# Patient Record
Sex: Male | Born: 1960 | Race: White | Hispanic: No | Marital: Single | Smoking: Never smoker
Health system: Southern US, Community
[De-identification: ages and names within clinical notes are randomized; demographics above are authoritative.]

## PROBLEM LIST (undated history)

## (undated) HISTORY — PX: LIPOMA EXCISION: SHX5283

---

## 2017-06-02 ENCOUNTER — Emergency Department (HOSPITAL_BASED_OUTPATIENT_CLINIC_OR_DEPARTMENT_OTHER)
Admission: EM | Admit: 2017-06-02 | Discharge: 2017-06-02 | Disposition: A | Discharge status: Home / Self Care | Payer: BLUE CROSS/BLUE SHIELD | Attending: Emergency Medicine | Admitting: Emergency Medicine

## 2017-06-02 ENCOUNTER — Other Ambulatory Visit: Payer: Self-pay

## 2017-06-02 ENCOUNTER — Emergency Department (HOSPITAL_BASED_OUTPATIENT_CLINIC_OR_DEPARTMENT_OTHER): Payer: BLUE CROSS/BLUE SHIELD

## 2017-06-02 ENCOUNTER — Encounter (HOSPITAL_BASED_OUTPATIENT_CLINIC_OR_DEPARTMENT_OTHER): Payer: Self-pay | Admitting: Emergency Medicine

## 2017-06-02 DIAGNOSIS — Z79899 Other long term (current) drug therapy: Secondary | ICD-10-CM | POA: Diagnosis not present

## 2017-06-02 DIAGNOSIS — N201 Calculus of ureter: Secondary | ICD-10-CM

## 2017-06-02 DIAGNOSIS — R39198 Other difficulties with micturition: Secondary | ICD-10-CM | POA: Insufficient documentation

## 2017-06-02 DIAGNOSIS — R112 Nausea with vomiting, unspecified: Secondary | ICD-10-CM | POA: Diagnosis not present

## 2017-06-02 DIAGNOSIS — R1031 Right lower quadrant pain: Secondary | ICD-10-CM | POA: Diagnosis present

## 2017-06-02 LAB — COMPREHENSIVE METABOLIC PANEL
ALBUMIN: 4.1 g/dL (ref 3.5–5.0)
ALT: 19 U/L (ref 17–63)
AST: 32 U/L (ref 15–41)
Alkaline Phosphatase: 75 U/L (ref 38–126)
Anion gap: 12 (ref 5–15)
BILIRUBIN TOTAL: 0.6 mg/dL (ref 0.3–1.2)
BUN: 17 mg/dL (ref 6–20)
CHLORIDE: 104 mmol/L (ref 101–111)
CO2: 22 mmol/L (ref 22–32)
Calcium: 8.9 mg/dL (ref 8.9–10.3)
Creatinine, Ser: 1.34 mg/dL — ABNORMAL HIGH (ref 0.61–1.24)
GFR calc Af Amer: >60 mL/min (ref >60)
GFR calc non Af Amer: 59 mL/min — ABNORMAL LOW (ref >60)
GLUCOSE: 134 mg/dL — AB (ref 65–99)
POTASSIUM: 3.8 mmol/L (ref 3.5–5.1)
Sodium: 138 mmol/L (ref 135–145)
TOTAL PROTEIN: 7.3 g/dL (ref 6.5–8.1)

## 2017-06-02 LAB — CBC
HEMATOCRIT: 42.3 % (ref 39.0–52.0)
Hemoglobin: 15.4 g/dL (ref 13.0–17.0)
MCH: 31.6 pg (ref 26.0–34.0)
MCHC: 36.4 g/dL — AB (ref 30.0–36.0)
MCV: 86.9 fL (ref 78.0–100.0)
Platelets: 184 10*3/uL (ref 150–400)
RBC: 4.87 MIL/uL (ref 4.22–5.81)
RDW: 12.3 % (ref 11.5–15.5)
WBC: 10.3 10*3/uL (ref 4.0–10.5)

## 2017-06-02 LAB — LIPASE, BLOOD: Lipase: 24 U/L (ref 11–51)

## 2017-06-02 LAB — URINALYSIS, ROUTINE W REFLEX MICROSCOPIC
BILIRUBIN URINE: NEGATIVE
GLUCOSE, UA: NEGATIVE mg/dL
KETONES UR: NEGATIVE mg/dL
Leukocytes, UA: NEGATIVE
Nitrite: NEGATIVE
Protein, ur: NEGATIVE mg/dL
Specific Gravity, Urine: 1.01 (ref 1.005–1.030)
pH: 7.5 (ref 5.0–8.0)

## 2017-06-02 LAB — URINALYSIS, MICROSCOPIC (REFLEX): Squamous Epithelial / LPF: NONE SEEN

## 2017-06-02 MED ORDER — OXYCODONE-ACETAMINOPHEN 5-325 MG PO TABS: 1.0000 | ORAL_TABLET | Freq: Four times a day (QID) | ORAL | 0 refills | Status: AC | PRN

## 2017-06-02 MED ORDER — ONDANSETRON HCL 4 MG/2ML IJ SOLN
4.0000 mg | Freq: Once | INTRAMUSCULAR | Status: AC | PRN
  Administered 2017-06-02: 4 mg via INTRAVENOUS
  Filled 2017-06-02: qty 2

## 2017-06-02 MED ORDER — SODIUM CHLORIDE 0.9 % IV BOLUS (SEPSIS)
1000.0000 mL | Freq: Once | INTRAVENOUS | Status: AC
  Administered 2017-06-02: 1000 mL via INTRAVENOUS

## 2017-06-02 MED ORDER — NAPROXEN 500 MG PO TABS: 500.0000 mg | ORAL_TABLET | Freq: Two times a day (BID) | ORAL | 0 refills | Status: AC

## 2017-06-02 MED ORDER — IOPAMIDOL (ISOVUE-300) INJECTION 61%
100.0000 mL | Freq: Once | INTRAVENOUS | Status: AC | PRN
  Administered 2017-06-02: 100 mL via INTRAVENOUS

## 2017-06-02 MED ORDER — ONDANSETRON 4 MG PO TBDP: 4.0000 mg | ORAL_TABLET | Freq: Three times a day (TID) | ORAL | 1 refills | Status: AC | PRN

## 2017-06-02 MED ORDER — KETOROLAC TROMETHAMINE 30 MG/ML IJ SOLN
30.0000 mg | Freq: Once | INTRAMUSCULAR | Status: AC
  Administered 2017-06-02: 30 mg via INTRAVENOUS
  Filled 2017-06-02: qty 1

## 2017-06-02 MED ORDER — SODIUM CHLORIDE 0.9 % IV SOLN: INTRAVENOUS | Status: DC

## 2017-06-02 MED ORDER — FENTANYL CITRATE (PF) 100 MCG/2ML IJ SOLN
50.0000 ug | INTRAMUSCULAR | Status: AC | PRN
  Administered 2017-06-02 (×2): 50 ug via INTRAVENOUS
  Filled 2017-06-02 (×2): qty 2

## 2017-06-02 MED ORDER — HYDROMORPHONE HCL 1 MG/ML IJ SOLN: 1.0000 mg | Freq: Once | INTRAMUSCULAR | Status: DC

## 2017-06-02 NOTE — ED Provider Notes (Signed)
[facility] EMERGENCY DEPARTMENT Provider Note   CSN: [id number] Arrival date & time: 06/02/17  0805     History   Chief Complaint Chief Complaint  Patient presents with  . Abdominal Pain    HPI Anthony Lynn is a 57 y.o. male.  Patient with acute onset of right lower quadrant abdominal pain about 4 hours ago.  Associated with nausea and vomiting.  No blood in the vomit no diarrhea.  She felt fine earlier.  Pain not made worse or better by anything.  Pain is listed as 10 out of 10.      History reviewed. No pertinent past medical history.  There are no active problems to display for this patient.   Past Surgical History:  Procedure Laterality Date  . LIPOMA EXCISION         Home Medications    Prior to Admission medications   Medication Sig Start Date End Date Taking? Authorizing Provider  traZODone (DESYREL) 100 MG tablet Take 100 mg by mouth at bedtime.   Yes [provider]  naproxen (NAPROSYN) 500 MG tablet Take 1 tablet (500 mg total) by mouth 2 (two) times daily. 06/02/17   Vanetta MuldersZackowski, Dashawn Bartnick, MD  ondansetron (ZOFRAN ODT) 4 MG disintegrating tablet Take 1 tablet (4 mg total) by mouth every 8 (eight) hours as needed. 06/02/17   Vanetta MuldersZackowski, Dondrell Loudermilk, MD  oxyCODONE-acetaminophen (PERCOCET/ROXICET) 5-325 MG tablet Take 1-2 tablets by mouth every 6 (six) hours as needed for severe pain. 06/02/17   Vanetta MuldersZackowski, Adeli Frost, MD    Family History No family history on file.  Social History Social History   Tobacco Use  . Smoking status: Never Smoker  . Smokeless tobacco: Never Used  Substance Use Topics  . Alcohol use: Yes  . Drug use: Not on file     Allergies   Hydrocodone   Review of Systems Review of Systems  Constitutional: Negative for fever.  Eyes: Negative for redness.  Respiratory: Negative for shortness of breath.   Cardiovascular: Negative for chest pain.  Gastrointestinal: Positive for abdominal pain.  Genitourinary: Positive for  difficulty urinating. Negative for flank pain.  Musculoskeletal: Negative for back pain.  Skin: Negative for rash.  Neurological: Negative for headaches.  Hematological: Does not bruise/bleed easily.  Psychiatric/Behavioral: Negative for confusion.     Physical Exam Updated Vital Signs BP (!) 142/100   Pulse (!) 109   Temp 97.7 F (36.5 C) (Oral)   Resp 18   Ht 1.702 m (5\' 7" )   Wt 90.7 kg (200 lb)   SpO2 97%   BMI 31.32 kg/m   Physical Exam  Constitutional: He is oriented to person, place, and time. He appears well-developed and well-nourished.  HENT:  Head: Normocephalic and atraumatic.  Mouth/Throat: Oropharynx is clear and moist.  Eyes: EOM are normal. Pupils are equal, round, and reactive to light.  Neck: Neck supple.  Cardiovascular: Normal rate and regular rhythm.  Pulmonary/Chest: Effort normal and breath sounds normal.  Abdominal: Soft. Bowel sounds are normal. There is no tenderness.  Musculoskeletal: Normal range of motion.  Neurological: He is alert and oriented to person, place, and time. No cranial nerve deficit or sensory deficit. He exhibits normal muscle tone. Coordination normal.  Skin: Skin is warm.  Nursing note and vitals reviewed.    ED Treatments / Results  Labs (all labs ordered are listed, but only abnormal results are displayed) Labs Reviewed  COMPREHENSIVE METABOLIC PANEL - Abnormal; Notable for the following components:  Result Value   Glucose, Bld 134 (*)    Creatinine, Ser 1.34 (*)    GFR calc non Af Amer 59 (*)    All other components within normal limits  CBC - Abnormal; Notable for the following components:   MCHC 36.4 (*)    All other components within normal limits  URINALYSIS, ROUTINE W REFLEX MICROSCOPIC - Abnormal; Notable for the following components:   Color, Urine STRAW (*)    Hgb urine dipstick MODERATE (*)    All other components within normal limits  URINALYSIS, MICROSCOPIC (REFLEX) - Abnormal; Notable for the  following components:   Bacteria, UA MANY (*)    All other components within normal limits  LIPASE, BLOOD    EKG  EKG Interpretation None       Radiology Ct Abdomen Pelvis W Contrast  Result Date: 06/02/2017 CLINICAL DATA:  Right lower quadrant and inguinal region pain with nausea and diarrhea EXAM: CT ABDOMEN AND PELVIS WITH CONTRAST TECHNIQUE: Multidetector CT imaging of the abdomen and pelvis was performed using the standard protocol following bolus administration of intravenous contrast. CONTRAST:  ISOVUE-300 IOPAMIDOL (ISOVUE-300) INJECTION 61% COMPARISON:  None. FINDINGS: Lower chest: There is right base atelectasis posteriorly. No lung base edema or consolidation. Hepatobiliary: There is no focal liver lesion evident. Gallbladder wall is not appreciably thickened. There is no biliary duct dilatation. Pancreas: No pancreatic mass or inflammatory focus. Spleen: No splenic lesions are evident. Adrenals/Urinary Tract: Adrenals bilaterally appear normal. There is no evident renal mass on either side. There is right renal edema with moderate perinephric fluid and stranding on the right. Similar changes are not present on the left. There is moderate hydronephrosis on the right. There is no hydronephrosis on the left. There is a 2 mm nonobstructing calculus in the posterior upper pole right kidney. There is a 1 mm calculus in the mid left kidney with a 1 mm calculus slightly more inferiorly on the left. There is ureterectasis on the right diffusely to the level of the right ureterovesical junction. At the right ureterovesical junction, there is a calculus measuring 5 x 4 mm. No other ureteral calculi are evident on either side. Urinary bladder is midline with wall thickness within normal limits. Stomach/Bowel: There is no appreciable bowel wall or mesenteric thickening. There is no evident bowel obstruction. No free air or portal venous air. Vascular/Lymphatic: There is mild atherosclerotic  calcification in the aorta and common iliac arteries. No evident aneurysm. There is a circumaortic left renal vein, an anatomic variant. Major mesenteric vessels appear patent. There is no adenopathy in the abdomen or pelvis. Reproductive: There are several prostatic calculi. Prostate and seminal vesicles are normal in size and contour. No pelvic mass evident. Other: Appendix appears normal. No abscess or ascites is evident in the abdomen or pelvis. There is a minimal ventral hernia containing only fat. Musculoskeletal: There is degenerative change in the lower thoracic and lumbar regions. There are no blastic or lytic bone lesions. There is no intramuscular or abdominal wall lesion. IMPRESSION: 1. 5 x 4 mm calculus right ureterovesical junction. There is moderate hydronephrosis in ureterectasis on the right. There is extensive perinephric stranding and fluid on the right with right renal edema. 2. Small nonobstructing calculi in each kidney. Several small prostatic calculi noted. 3.  No bowel obstruction.  No abscess.  Appendix appears normal. 4.  Minimal ventral hernia containing only fat. 5.  Mild aortic atherosclerosis. Aortic Atherosclerosis (ICD10-I70.0). Electronically Signed   By: Bretta Bang  III M.D.   On: 06/02/2017 09:28    Procedures Procedures (including critical care time)  Medications Ordered in ED Medications  0.9 %  sodium chloride infusion (not administered)  ondansetron (ZOFRAN) injection 4 mg (4 mg Intravenous Given 06/02/17 0815)  fentaNYL (SUBLIMAZE) injection 50 mcg (50 mcg Intravenous Given 06/02/17 0857)  sodium chloride 0.9 % bolus 1,000 mL (0 mLs Intravenous Stopped 06/02/17 1025)  iopamidol (ISOVUE-300) 61 % injection 100 mL (100 mLs Intravenous Contrast Given 06/02/17 0901)  ondansetron (ZOFRAN) injection 4 mg (4 mg Intravenous Given 06/02/17 0858)  ketorolac (TORADOL) 30 MG/ML injection 30 mg (30 mg Intravenous Given 06/02/17 0955)     Initial Impression / Assessment  and Plan / ED Course  I have reviewed the triage vital signs and the nursing notes.  Pertinent labs & imaging results that were available during my care of the patient were reviewed by me and considered in my medical decision making (see chart for details).    Workup with finding of 4 mm right ureter distal kidney stone.  Explaining patient's symptoms.  No complicating factors.  Patient with significant improvement in pain here with Toradol and nausea control.  Patient also received fentanyl x2.  Patient states he has a urologist.  Has not seen him for a period of time.  No past history of stones.  Patient will be treated with Naprosyn, Percocet, because patient's had some difficulty with hydrocodone questionably in the past.  And patient given Zofran.   Final Clinical Impressions(s) / ED Diagnoses   Final diagnoses:  Right ureteral stone    ED Discharge Orders        Ordered    naproxen (NAPROSYN) 500 MG tablet  2 times daily     06/02/17 1054    ondansetron (ZOFRAN ODT) 4 MG disintegrating tablet  Every 8 hours PRN     06/02/17 1054    oxyCODONE-acetaminophen (PERCOCET/ROXICET) 5-325 MG tablet  Every 6 hours PRN     06/02/17 1057       Vanetta Mulders, MD 06/02/17 1118

## 2017-06-02 NOTE — ED Triage Notes (Signed)
RLQ pain with N/V x 4 hours.

## 2017-06-02 NOTE — ED Notes (Signed)
Pt O2 sats dropped to 85%. Pt placed on O2 2lpm via nasal cannula.

## 2017-06-02 NOTE — ED Notes (Signed)
ED Provider at bedside. 

## 2017-06-02 NOTE — Discharge Instructions (Signed)
Take the Naprosyn on a regular basis.  Supplement with the narcotic pain medicine as needed for additional pain relief.  Take the Zofran to prevent nausea vomiting.  Follow-up with urologist for the kidney stone.  Return for any new or worse symptoms.

## 2017-06-02 NOTE — ED Notes (Signed)
Patient transported to CT 

## 2019-08-19 IMAGING — CT CT ABD-PELV W/ CM
2 of 5 series · 15 of 46 positions shown, 17 images · IV contrast (iopamidol)
Comparison: None.

CLINICAL DATA: Right lower quadrant and inguinal region pain with
nausea and diarrhea

EXAM:
CT ABDOMEN AND PELVIS WITH CONTRAST
TECHNIQUE: Multidetector CT imaging of the abdomen and pelvis was performed
using the standard protocol following bolus administration of
intravenous contrast.
CONTRAST:  100mL 17RPHR-G55 IOPAMIDOL (17RPHR-G55) INJECTION 61%

[Series 2: axial st · axial · 0.79mm/px · z∈[-532,-42]mm · 12 of 110 slices shown, 14 images]
[im 6/110  soft-tissue]
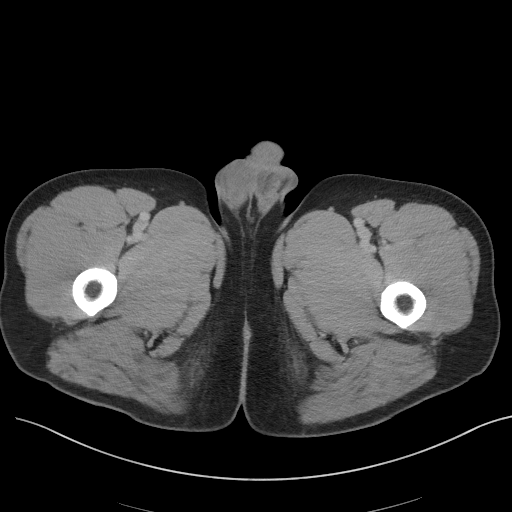
[im 6/110  bone]
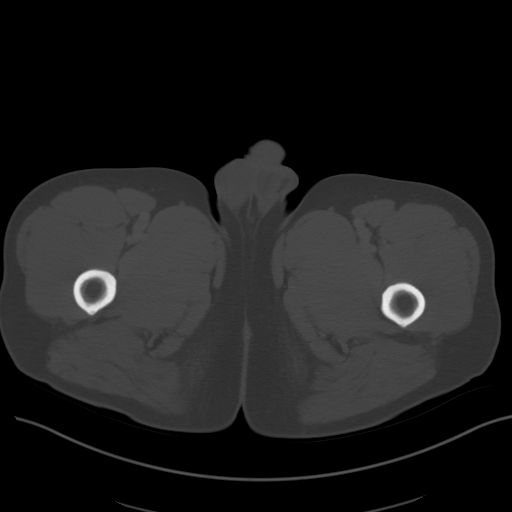
[im 18/110  soft-tissue]
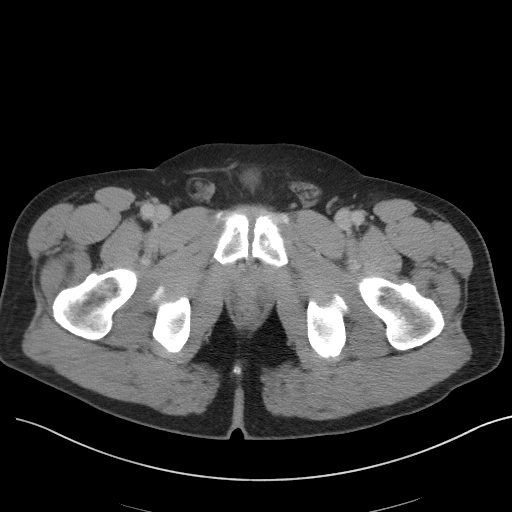
[im 23/110  soft-tissue]
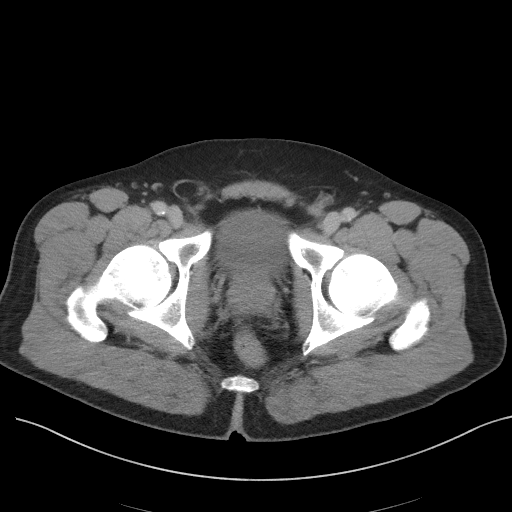
[im 35/110  soft-tissue]
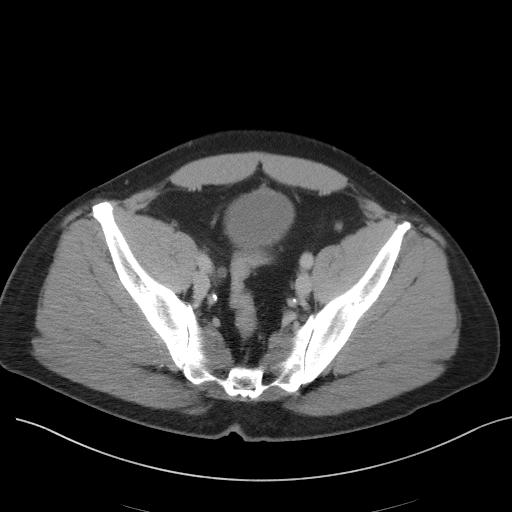
[im 41/110  soft-tissue]
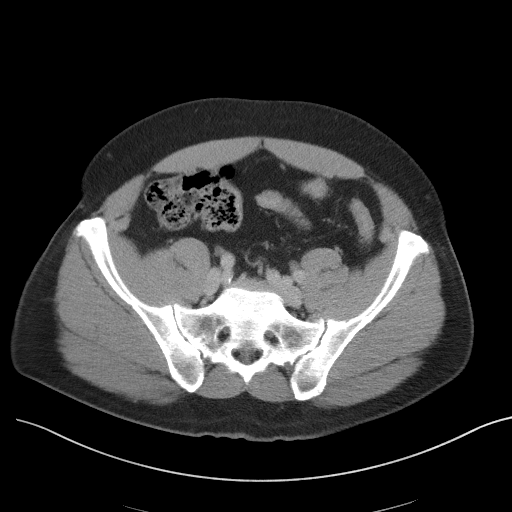
[im 52/110  soft-tissue]
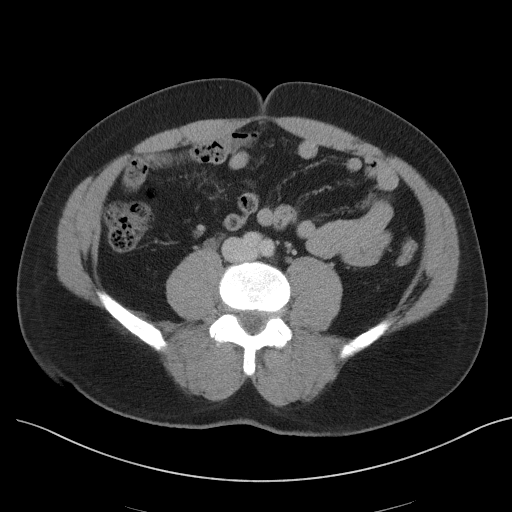
[im 58/110  soft-tissue]
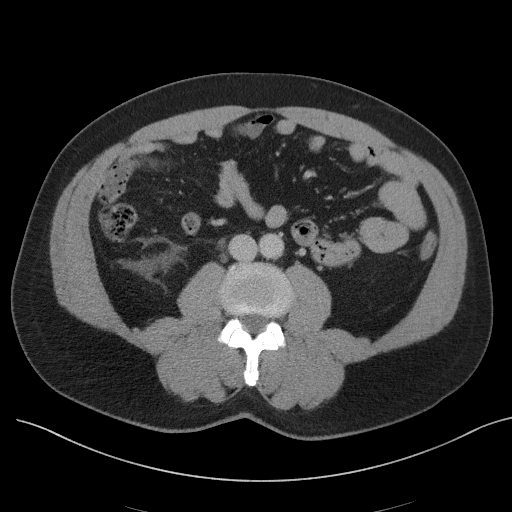
[im 69/110  soft-tissue]
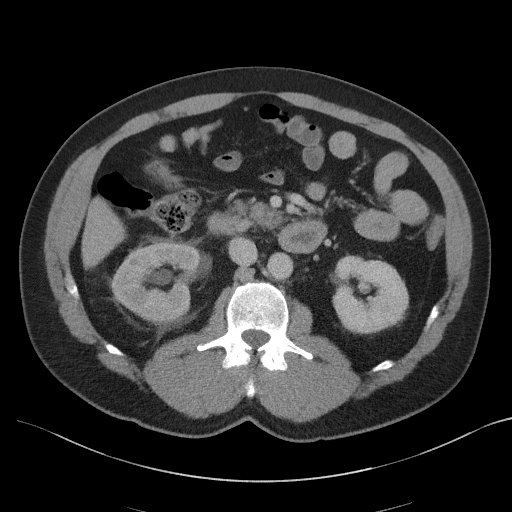
[im 75/110  soft-tissue]
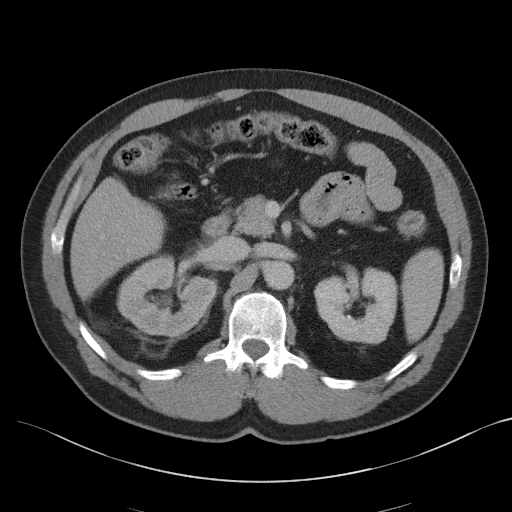
[im 75/110  bone]
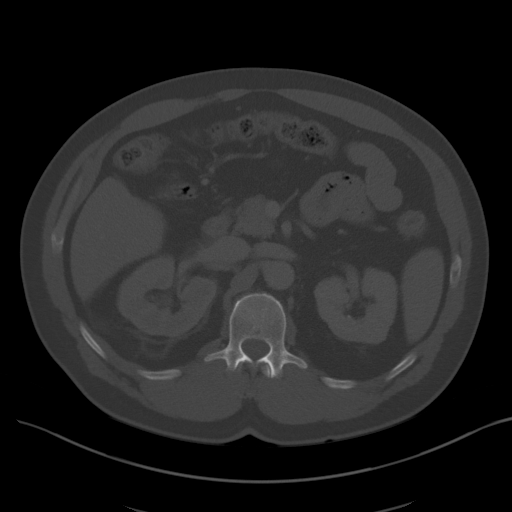
[im 87/110  soft-tissue]
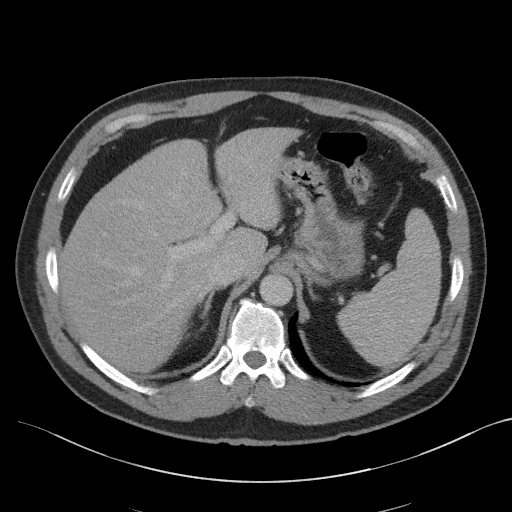
[im 92/110  soft-tissue]
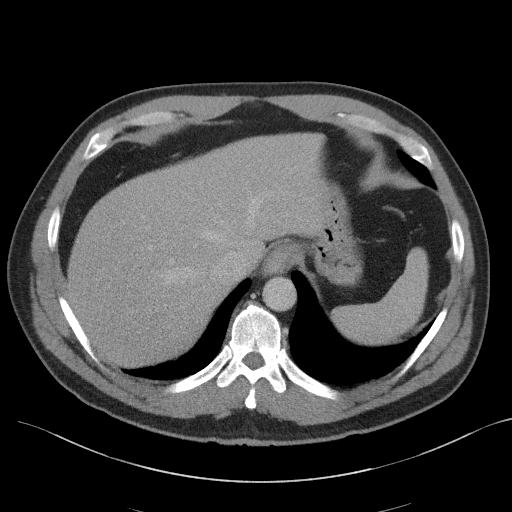
[im 104/110  soft-tissue]
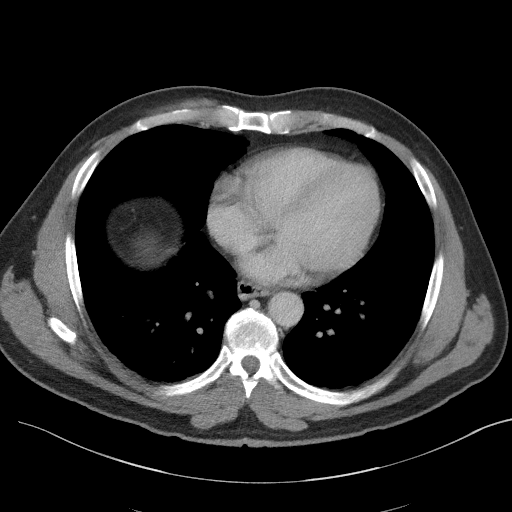

[Series 5: coronal st · coronal · 0.85mm/px · 3 of 95 slices shown]
[im 32/95  soft-tissue]
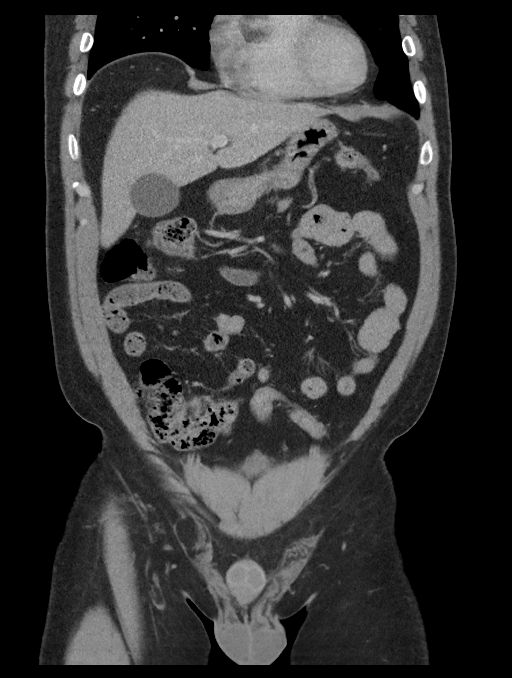
[im 42/95  soft-tissue]
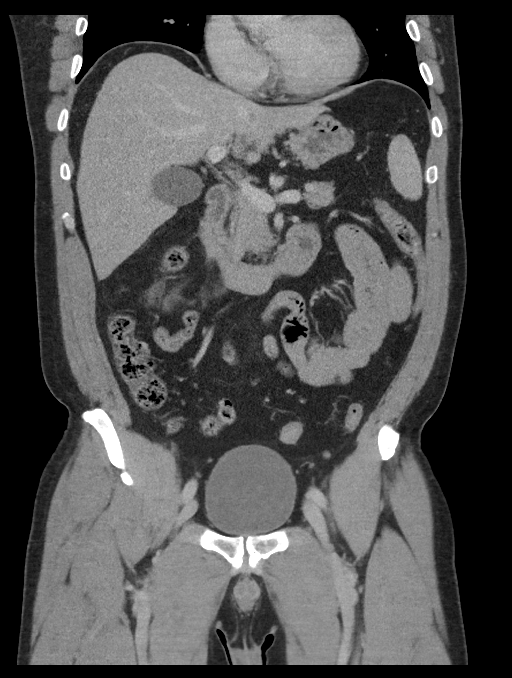
[im 53/95  soft-tissue]
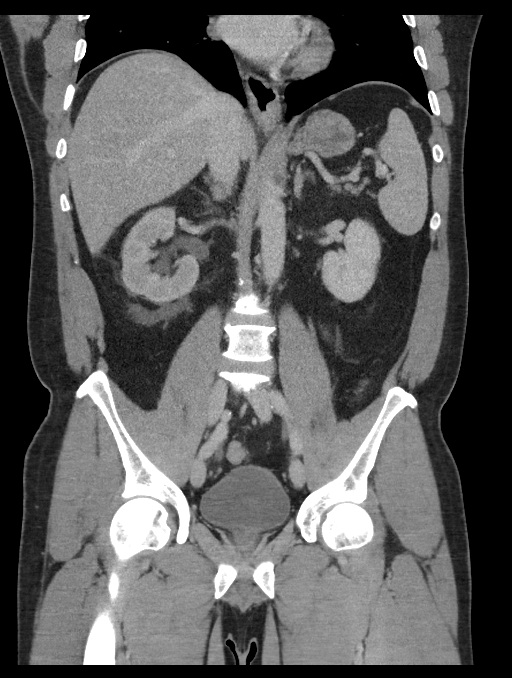

[15 of 46 positions shown; findings below may reference images not displayed]

FINDINGS: Lower chest: There is right base atelectasis posteriorly. No lung
base edema or consolidation.

Hepatobiliary: There is no focal liver lesion evident. Gallbladder
wall is not appreciably thickened. There is no biliary duct
dilatation.

Pancreas: No pancreatic mass or inflammatory focus.

Spleen: No splenic lesions are evident.

Adrenals/Urinary Tract: Adrenals bilaterally appear normal. There is
no evident renal mass on either side. There is right renal edema
with moderate perinephric fluid and stranding on the right. Similar
changes are not present on the left. There is moderate
hydronephrosis on the right. There is no hydronephrosis on the left.
There is a 2 mm nonobstructing calculus in the posterior upper pole
right kidney. There is a 1 mm calculus in the mid left kidney with a
1 mm calculus slightly more inferiorly on the left. There is
ureterectasis on the right diffusely to the level of the right
ureterovesical junction. At the right ureterovesical junction, there
is a calculus measuring 5 x 4 mm. No other ureteral calculi are
evident on either side. Urinary bladder is midline with wall
thickness within normal limits.

Stomach/Bowel: There is no appreciable bowel wall or mesenteric
thickening. There is no evident bowel obstruction. No free air or
portal venous air.

Vascular/Lymphatic: There is mild atherosclerotic calcification in
the aorta and common iliac arteries. No evident aneurysm. There is a
circumaortic left renal vein, an anatomic variant. Major mesenteric
vessels appear patent. There is no adenopathy in the abdomen or
pelvis.

Reproductive: There are several prostatic calculi. Prostate and
seminal vesicles are normal in size and contour. No pelvic mass
evident.

Other: Appendix appears normal. No abscess or ascites is evident in
the abdomen or pelvis. There is a minimal ventral hernia containing
only fat.

Musculoskeletal: There is degenerative change in the lower thoracic
and lumbar regions. There are no blastic or lytic bone lesions.
There is no intramuscular or abdominal wall lesion.
IMPRESSION: 1. 5 x 4 mm calculus right ureterovesical junction. There is
moderate hydronephrosis in ureterectasis on the right. There is
extensive perinephric stranding and fluid on the right with right
renal edema.

2. Small nonobstructing calculi in each kidney. Several small
prostatic calculi noted.

3.  No bowel obstruction.  No abscess.  Appendix appears normal.

4.  Minimal ventral hernia containing only fat.

5.  Mild aortic atherosclerosis.

Aortic Atherosclerosis ([id number]).
